# Patient Record
Sex: Female | Born: 2000
Health system: Southern US, Community
[De-identification: ages and names within clinical notes are randomized; demographics above are authoritative.]

## PROBLEM LIST (undated history)

## (undated) DIAGNOSIS — F329 Major depressive disorder, single episode, unspecified: Secondary | ICD-10-CM

## (undated) DIAGNOSIS — G4730 Sleep apnea, unspecified: Secondary | ICD-10-CM

## (undated) DIAGNOSIS — F32A Depression, unspecified: Secondary | ICD-10-CM

---
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
## 2018-01-05 DIAGNOSIS — S83006A Unspecified dislocation of unspecified patella, initial encounter: Secondary | ICD-10-CM | POA: Diagnosis not present

## 2018-01-10 DIAGNOSIS — S83006A Unspecified dislocation of unspecified patella, initial encounter: Secondary | ICD-10-CM | POA: Diagnosis not present

## 2018-01-12 DIAGNOSIS — S83006A Unspecified dislocation of unspecified patella, initial encounter: Secondary | ICD-10-CM | POA: Diagnosis not present

## 2018-01-17 DIAGNOSIS — S83006A Unspecified dislocation of unspecified patella, initial encounter: Secondary | ICD-10-CM | POA: Diagnosis not present

## 2018-01-19 DIAGNOSIS — S83006A Unspecified dislocation of unspecified patella, initial encounter: Secondary | ICD-10-CM | POA: Diagnosis not present

## 2018-01-26 DIAGNOSIS — S83005D Unspecified dislocation of left patella, subsequent encounter: Secondary | ICD-10-CM | POA: Diagnosis not present

## 2018-01-27 DIAGNOSIS — Z23 Encounter for immunization: Secondary | ICD-10-CM | POA: Diagnosis not present

## 2018-01-31 DIAGNOSIS — S83005D Unspecified dislocation of left patella, subsequent encounter: Secondary | ICD-10-CM | POA: Diagnosis not present

## 2018-02-02 DIAGNOSIS — S83005D Unspecified dislocation of left patella, subsequent encounter: Secondary | ICD-10-CM | POA: Diagnosis not present

## 2018-02-06 DIAGNOSIS — M25362 Other instability, left knee: Secondary | ICD-10-CM | POA: Diagnosis not present

## 2018-02-07 DIAGNOSIS — S83005D Unspecified dislocation of left patella, subsequent encounter: Secondary | ICD-10-CM | POA: Diagnosis not present

## 2018-02-13 DIAGNOSIS — S83005D Unspecified dislocation of left patella, subsequent encounter: Secondary | ICD-10-CM | POA: Diagnosis not present

## 2018-02-21 DIAGNOSIS — S83005D Unspecified dislocation of left patella, subsequent encounter: Secondary | ICD-10-CM | POA: Diagnosis not present

## 2018-03-01 DIAGNOSIS — F4323 Adjustment disorder with mixed anxiety and depressed mood: Secondary | ICD-10-CM | POA: Diagnosis not present

## 2018-03-10 ENCOUNTER — Ambulatory Visit: Payer: BLUE CROSS/BLUE SHIELD | Attending: Orthopedic Surgery | Admitting: Physical Therapy

## 2018-03-10 ENCOUNTER — Encounter: Payer: Self-pay | Admitting: Physical Therapy

## 2018-03-10 DIAGNOSIS — M25662 Stiffness of left knee, not elsewhere classified: Secondary | ICD-10-CM | POA: Diagnosis not present

## 2018-03-10 DIAGNOSIS — R262 Difficulty in walking, not elsewhere classified: Secondary | ICD-10-CM

## 2018-03-10 NOTE — Therapy (Signed)
[facility] [address] [place], [place], [address] Phone: [phone]   Fax:  [phone]  Physical Therapy Evaluation  Patient Details  Name: Patricia Tanner MRN: [id number] Date of Birth: 04/24/2000 Referring Provider (PT): Patsy LagerAlex Creighton   Encounter Date: 03/10/2018  PT End of Session - 03/10/18 0925    Visit Number  1    Number of Visits  9    Date for PT Re-Evaluation  05/11/18    PT Start Time  0845    PT Stop Time  0936    PT Time Calculation (min)  51 min    Activity Tolerance  Patient tolerated treatment well    Behavior During Therapy  [facility] for tasks assessed/performed       Past Medical History:  Diagnosis Date  . Depression   . Sleep apnea     History reviewed. No pertinent surgical history.  There were no vitals filed for this visit.   Subjective Assessment - 03/10/18 0854    Subjective  Has left patella dislocation about 5-6 years ago no issues since.  Reports that in august she was going down the stairs in August, it subluxed again and continued to have issues and pain.  Had left MPFL reconstruction 11/22/17.  [facility], she will be on break for the next few weeks and needs to continue PT for strength    Limitations  Lifting;Walking;House hold activities    Patient Stated Goals  be stronger, have no pain    Currently in Pain?  Yes    Pain Score  1     Pain Location  Knee    Pain Orientation  Left;Medial    Pain Descriptors / Indicators  Aching;Sore    Pain Type  Acute pain    Pain Onset  More than a month ago    Pain Frequency  Intermittent    Aggravating Factors   stairs, walking, squatting pain up to 7/10     Pain Relieving Factors  rest no pain    Effect of Pain on Daily Activities  pain at the end of the day         [facility] - 03/10/18 0001      Assessment   Medical Diagnosis  s/p left MPFL reconstruction    Referring  Provider (PT)  Patsy LagerAlex Creighton    Onset Date/Surgical Date  11/22/17    Prior Therapy  yes      Precautions   Precautions  None      Balance Screen   Has the patient fallen in the past 6 months  Yes    How many times?  1    Has the patient had a decrease in activity level because of a fear of falling?   No    Is the patient reluctant to leave their home because of a fear of falling?   No      Home Environment   Additional Comments  has stairs at home, at school      Prior Function   Level of Independence  Independent    Vocation  Student    Vocation Requirements  [employer]    Leisure  likes to jog, was doing gym activites      ROM / Strength   AROM / PROM / Strength  AROM;PROM;Strength      AROM   Overall AROM Comments  AROM  of the left knee 9-107 degrees with c/o tightness      PROM   Overall PROM Comments  PROM of the left knee 0-118 degrees with pain for flexion      Strength   Overall Strength Comments  4/5 no pain      Flexibility   Soft Tissue Assessment /Muscle Length  yes    Hamstrings  tight    Quadriceps  tight      Palpation   Palpation comment  some mild lateral tracking pain, weak VMO decreased contraction      Ambulation/Gait   Gait Comments  first few steps when she gets up are pretty stiff and antalgic, it smooths out as she goes, descending stairs there is pain and weakness, at times she reports that she does one at a time                Objective measurements completed on examination: See above findings.      [facility] Adult PT Treatment/Exercise - 03/10/18 0001      Exercises   Exercises  Knee/Hip      Knee/Hip Exercises: Aerobic   Elliptical  R=7 I=10x 4 minutes      Knee/Hip Exercises: Standing   Walking with Sports Cord  all directions    Other Standing Knee Exercises  Squats on Bosu      Knee/Hip Exercises: Supine   Short Arc Quad Sets Limitations  use of Russian 4on/12off x 9 minutes, al ot of verbal and tactile cues to work on  [employer]MO and TKE             PT Education - 03/10/18 0924    Education Details  HEP for  VMO SAQ, QS, and knee flexion    Person(s) Educated  Patient    Methods  Explanation;Demonstration;Tactile cues;Verbal cues;Handout    Comprehension  Verbalized understanding;Returned demonstration;Verbal cues required;Tactile cues required       PT Short Term Goals - 03/10/18 0935      PT SHORT TERM GOAL #1   Title  independent with initial HEP    Time  1    Period  Weeks    Status  New        PT Long Term Goals - 03/10/18 0935      PT LONG TERM GOAL #1   Title  increase knee extnesion to full    Time  4    Period  Weeks    Status  New      PT LONG TERM GOAL #2   Title  increase ability to descend stairs at all times step over step    Time  8    Period  Weeks    Status  New             Plan - 03/10/18 0925    Clinical Impression Statement  Patient underwent left MPFL on 11/22/17, [facility] reports doing well overall but still pain, has decreased ROM, and very weak VMO.  AROM is 9-107 degrees flexion, poor TKE, VMO and tight quad, her first few steps are antalgic and stiff and then it smooths out.  Stairs are difficult to descend    Clinical Presentation  Stable    Clinical Decision Making  Low    PT Frequency  1x / week    PT Duration  4 weeks    PT Treatment/Interventions  ADLs/Self Care Home Management;Cryotherapy;Electrical Stimulation;Therapeutic exercise;Therapeutic activities;Functional mobility training;Stair training;Gait training;Balance  training;Neuromuscular re-education;Patient/family education    PT Next Visit Plan  increase VMO, TKE and gain flexion    Consulted and Agree with Plan of Care  Patient       Patient will benefit from skilled therapeutic intervention in order to improve the following deficits and impairments:  Abnormal gait, Pain, Decreased range of motion, Decreased strength, Impaired flexibility, Decreased balance  Visit  Diagnosis: Stiffness of left knee, not elsewhere classified - Plan: PT plan of care cert/re-cert  Difficulty in walking, not elsewhere classified - Plan: PT plan of care cert/re-cert     Problem List There are no active problems to display for this patient.   Jearld Lesch., PT 03/10/2018, 9:38 AM  [facility] [address] [place], [place], [address] Phone: [phone]   Fax:  [phone]  Name: Patricia Tanner MRN: [id number] Date of Birth: Jun 23, 2000

## 2018-03-13 DIAGNOSIS — F322 Major depressive disorder, single episode, severe without psychotic features: Secondary | ICD-10-CM | POA: Diagnosis not present

## 2018-03-13 DIAGNOSIS — L709 Acne, unspecified: Secondary | ICD-10-CM | POA: Diagnosis not present

## 2018-03-13 DIAGNOSIS — F411 Generalized anxiety disorder: Secondary | ICD-10-CM | POA: Diagnosis not present

## 2018-03-14 ENCOUNTER — Ambulatory Visit: Payer: BLUE CROSS/BLUE SHIELD | Admitting: Physical Therapy

## 2018-03-14 DIAGNOSIS — R262 Difficulty in walking, not elsewhere classified: Secondary | ICD-10-CM

## 2018-03-14 DIAGNOSIS — M25662 Stiffness of left knee, not elsewhere classified: Secondary | ICD-10-CM

## 2018-03-14 NOTE — Therapy (Signed)
[facility] [address] [place], [place], [address] Phone: [phone]   Fax:  [phone]  Physical Therapy Treatment  Patient Details  Name: Patricia SpanishMaansi Janak Lazaro MRN: [id number] Date of Birth: 2000/08/11 Referring Provider (PT): Patsy LagerAlex Creighton   Encounter Date: 03/14/2018  PT End of Session - 03/14/18 0835    Visit Number  2    Number of Visits  9    Date for PT Re-Evaluation  05/11/18    PT Start Time  0800    PT Stop Time  0850    PT Time Calculation (min)  50 min       Past Medical History:  Diagnosis Date  . Depression   . Sleep apnea     No past surgical history on file.  There were no vitals filed for this visit.  Subjective Assessment - 03/14/18 0804    Subjective  no pain , just alittle stiff    Currently in Pain?  No/denies                       [facility] Adult PT Treatment/Exercise - 03/14/18 0001      Knee/Hip Exercises: Aerobic   Elliptical  R=7 I=10x 3 fwd/3 back    Tread Mill  2 min each side      Knee/Hip Exercises: Machines for Strengthening   Cybex Leg Press  VMO ball squeeze 30# 3 sets 10      Knee/Hip Exercises: Standing   Hip Flexion  Stengthening;Left;20 reps;Knee straight   2nd set with ER, green tband   Wall Squat  2 sets;5 reps;10 seconds   VMO ball squeeze   SLS with Vectors  0n airex    Walking with Sports Cord  all directions   resisted running man     Knee/Hip Exercises: Supine   Short Arc Quad Sets Limitations  Russian 12 min 5 on/5 off with SAQ    cuing for [employer]MO activation     Modalities   Modalities  Electrical Stimulation               PT Short Term Goals - 03/10/18 0935      PT SHORT TERM GOAL #1   Title  independent with initial HEP    Time  1    Period  Weeks    Status  New        PT Long Term Goals - 03/10/18 0935      PT LONG TERM GOAL #1   Title  increase knee extnesion to full    Time  4    Period  Weeks    Status   New      PT LONG TERM GOAL #2   Title  increase ability to descend stairs at all times step over step    Time  8    Period  Weeks    Status  New            Plan - 03/14/18 [id number]    Clinical Impression Statement  pt tolerated ther ex well, some visable shaking of left quad and cuing to activate VMO with exercises. cued withgait to flex left knee in swing phase .    PT Treatment/Interventions  ADLs/Self Care Home Management;Cryotherapy;Electrical Stimulation;Therapeutic exercise;Therapeutic activities;Functional mobility training;Stair training;Gait training;Balance training;Neuromuscular re-education;Patient/family education    PT Next Visit Plan  increase VMO, TKE and gain flexion       Patient will benefit  from skilled therapeutic intervention in order to improve the following deficits and impairments:  Abnormal gait, Pain, Decreased range of motion, Decreased strength, Impaired flexibility, Decreased balance  Visit Diagnosis: Stiffness of left knee, not elsewhere classified  Difficulty in walking, not elsewhere classified     Problem List There are no active problems to display for this patient.   PAYSEUR,ANGIE PTA 03/14/2018, 8:37 AM  [facility] [address] [place], [place], [address] Phone: [phone]   Fax:  [phone]  Name: Patricia SpanishMaansi Janak Zeiner MRN: [id number] Date of Birth: 2000-09-29

## 2018-03-24 ENCOUNTER — Ambulatory Visit: Payer: BLUE CROSS/BLUE SHIELD | Attending: Orthopedic Surgery | Admitting: Physical Therapy

## 2018-03-24 DIAGNOSIS — R262 Difficulty in walking, not elsewhere classified: Secondary | ICD-10-CM | POA: Diagnosis not present

## 2018-03-24 DIAGNOSIS — M25662 Stiffness of left knee, not elsewhere classified: Secondary | ICD-10-CM | POA: Insufficient documentation

## 2018-03-24 NOTE — Therapy (Signed)
[place], [place], [address] Phone: [phone]   Fax:  [phone]  Physical Therapy Treatment  Patient Details  Name: Patricia Tanner MRN: [id number] Date of Birth: [birth year] Referring Provider (PT): Lennox Solders   Encounter Date: 03/24/2018  PT End of Session - 03/24/18 1003    Visit Number  3    Number of Visits  9    Date for PT Re-Evaluation  05/11/18    PT Start Time  0930    PT Stop Time  1028    PT Time Calculation (min)  58 min       Past Medical History:  Diagnosis Date  . Depression   . Sleep apnea     No past surgical history on file.  There were no vitals filed for this visit.  Subjective Assessment - 03/24/18 0933    Subjective  trying to bend more with walk and alittle more sore    Currently in Pain?  Yes    Pain Score  6     Pain Location  Knee    Pain Orientation  Left                       [facility] Adult PT Treatment/Exercise - 03/24/18 0001      Knee/Hip Exercises: Aerobic   Elliptical  R=7 I=10x 3 fwd/3 back    Tread Mill  2 min each side      Knee/Hip Exercises: Machines for Strengthening   Cybex Knee Extension  VMO ball squeeze Left only 4 sets 5 5#    Cybex Leg Press  VMO ball squeeze 30# 3 sets 10    Other Machine  cable press down for TKE 2 sets 15 30#      Knee/Hip Exercises: Standing   Lateral Step Up  Left;15 reps;Hand Hold: 2;Step Height: 6"    Forward Step Up  Left;15 reps;Step Height: 6";Hand Hold: 2    Other Standing Knee Exercises  monster walks with tband    Other Standing Knee Exercises  TKE pushing ball into wall hold 3 sec 15      Knee/Hip Exercises: Supine   Short Arc Quad Sets Limitations  Russian 5on/5 off 3# with SAQ   10 min     Modalities   Modalities  Teacher, English as a foreign language Location  left knee    Electrical Stimulation Action  IFC    Electrical  Stimulation Parameters  supine with ice pack    Electrical Stimulation Goals  Edema;Pain               PT Short Term Goals - 03/24/18 0934      PT SHORT TERM GOAL #1   Title  independent with initial HEP    Status  Achieved        PT Long Term Goals - 03/24/18 0934      PT LONG TERM GOAL #1   Title  increase knee extnesion to full    Status  Partially Met      PT LONG TERM GOAL #2   Title  increase ability to descend stairs at all times step over step    Status  Partially Met            Plan - 03/24/18 0934    Clinical Impression Statement  STG met. LTG partially met. Increased swelling noted today.  Tolerated ther ex well.    PT Treatment/Interventions  ADLs/Self Care Home Management;Cryotherapy;Electrical Stimulation;Therapeutic exercise;Therapeutic activities;Functional mobility training;Stair training;Gait training;Balance training;Neuromuscular re-education;Patient/family education    PT Next Visit Plan  D/C as pt will resume PT when returns to college Monday       Patient will benefit from skilled therapeutic intervention in order to improve the following deficits and impairments:  Abnormal gait, Pain, Decreased range of motion, Decreased strength, Impaired flexibility, Decreased balance  Visit Diagnosis: Stiffness of left knee, not elsewhere classified  Difficulty in walking, not elsewhere classified     Problem List There are no active problems to display for this patient.  PHYSICAL THERAPY DISCHARGE SUMMARY   Plan: Patient agrees to discharge.  Patient goals were partially met. Patient is being discharged due to the patient's request.  ?????      PAYSEUR,ANGIE PTA 03/24/2018, 10:03 AM  [facility], [place], [address] Phone: [phone]   Fax:  [phone]  Name: Patricia Tanner MRN: [id number] Date of Birth: February 16, 2001

## 2018-04-06 DIAGNOSIS — F4323 Adjustment disorder with mixed anxiety and depressed mood: Secondary | ICD-10-CM | POA: Diagnosis not present

## 2018-04-07 DIAGNOSIS — L700 Acne vulgaris: Secondary | ICD-10-CM | POA: Diagnosis not present

## 2018-04-14 DIAGNOSIS — F322 Major depressive disorder, single episode, severe without psychotic features: Secondary | ICD-10-CM | POA: Diagnosis not present

## 2018-04-19 DIAGNOSIS — S83005D Unspecified dislocation of left patella, subsequent encounter: Secondary | ICD-10-CM | POA: Diagnosis not present

## 2018-04-21 DIAGNOSIS — F4323 Adjustment disorder with mixed anxiety and depressed mood: Secondary | ICD-10-CM | POA: Diagnosis not present

## 2018-04-26 DIAGNOSIS — S83005D Unspecified dislocation of left patella, subsequent encounter: Secondary | ICD-10-CM | POA: Diagnosis not present

## 2018-05-03 DIAGNOSIS — S83005D Unspecified dislocation of left patella, subsequent encounter: Secondary | ICD-10-CM | POA: Diagnosis not present

## 2018-05-08 DIAGNOSIS — Z13220 Encounter for screening for lipoid disorders: Secondary | ICD-10-CM | POA: Diagnosis not present

## 2018-05-08 DIAGNOSIS — L700 Acne vulgaris: Secondary | ICD-10-CM | POA: Diagnosis not present

## 2018-05-17 DIAGNOSIS — S83005D Unspecified dislocation of left patella, subsequent encounter: Secondary | ICD-10-CM | POA: Diagnosis not present

## 2018-05-19 DIAGNOSIS — F322 Major depressive disorder, single episode, severe without psychotic features: Secondary | ICD-10-CM | POA: Diagnosis not present

## 2018-05-24 DIAGNOSIS — S83005D Unspecified dislocation of left patella, subsequent encounter: Secondary | ICD-10-CM | POA: Diagnosis not present

## 2018-06-03 DIAGNOSIS — F4323 Adjustment disorder with mixed anxiety and depressed mood: Secondary | ICD-10-CM | POA: Diagnosis not present

## 2018-06-08 DIAGNOSIS — Z79899 Other long term (current) drug therapy: Secondary | ICD-10-CM | POA: Diagnosis not present

## 2018-06-09 DIAGNOSIS — S83005D Unspecified dislocation of left patella, subsequent encounter: Secondary | ICD-10-CM | POA: Diagnosis not present

## 2018-07-20 DIAGNOSIS — Z79899 Other long term (current) drug therapy: Secondary | ICD-10-CM | POA: Diagnosis not present

## 2018-08-29 DIAGNOSIS — F322 Major depressive disorder, single episode, severe without psychotic features: Secondary | ICD-10-CM | POA: Diagnosis not present

## 2018-09-08 DIAGNOSIS — L700 Acne vulgaris: Secondary | ICD-10-CM | POA: Diagnosis not present

## 2018-10-03 DIAGNOSIS — F330 Major depressive disorder, recurrent, mild: Secondary | ICD-10-CM | POA: Diagnosis not present

## 2018-10-03 DIAGNOSIS — F411 Generalized anxiety disorder: Secondary | ICD-10-CM | POA: Diagnosis not present

## 2018-10-03 DIAGNOSIS — F902 Attention-deficit hyperactivity disorder, combined type: Secondary | ICD-10-CM | POA: Diagnosis not present

## 2018-10-11 DIAGNOSIS — F330 Major depressive disorder, recurrent, mild: Secondary | ICD-10-CM | POA: Diagnosis not present

## 2018-10-11 DIAGNOSIS — F411 Generalized anxiety disorder: Secondary | ICD-10-CM | POA: Diagnosis not present

## 2018-10-11 DIAGNOSIS — F902 Attention-deficit hyperactivity disorder, combined type: Secondary | ICD-10-CM | POA: Diagnosis not present

## 2018-10-25 DIAGNOSIS — F902 Attention-deficit hyperactivity disorder, combined type: Secondary | ICD-10-CM | POA: Diagnosis not present

## 2018-10-25 DIAGNOSIS — F411 Generalized anxiety disorder: Secondary | ICD-10-CM | POA: Diagnosis not present

## 2018-10-25 DIAGNOSIS — F330 Major depressive disorder, recurrent, mild: Secondary | ICD-10-CM | POA: Diagnosis not present

## 2018-11-02 DIAGNOSIS — Z79899 Other long term (current) drug therapy: Secondary | ICD-10-CM | POA: Diagnosis not present

## 2018-11-07 DIAGNOSIS — F909 Attention-deficit hyperactivity disorder, unspecified type: Secondary | ICD-10-CM | POA: Diagnosis not present

## 2018-11-07 DIAGNOSIS — F1210 Cannabis abuse, uncomplicated: Secondary | ICD-10-CM | POA: Diagnosis not present

## 2018-11-07 DIAGNOSIS — F322 Major depressive disorder, single episode, severe without psychotic features: Secondary | ICD-10-CM | POA: Diagnosis not present

## 2018-12-01 DIAGNOSIS — F4323 Adjustment disorder with mixed anxiety and depressed mood: Secondary | ICD-10-CM | POA: Diagnosis not present

## 2018-12-05 DIAGNOSIS — F909 Attention-deficit hyperactivity disorder, unspecified type: Secondary | ICD-10-CM | POA: Diagnosis not present

## 2018-12-05 DIAGNOSIS — F322 Major depressive disorder, single episode, severe without psychotic features: Secondary | ICD-10-CM | POA: Diagnosis not present

## 2018-12-22 DIAGNOSIS — F4323 Adjustment disorder with mixed anxiety and depressed mood: Secondary | ICD-10-CM | POA: Diagnosis not present

## 2019-01-16 DIAGNOSIS — F4323 Adjustment disorder with mixed anxiety and depressed mood: Secondary | ICD-10-CM | POA: Diagnosis not present

## 2019-02-06 DIAGNOSIS — F4323 Adjustment disorder with mixed anxiety and depressed mood: Secondary | ICD-10-CM | POA: Diagnosis not present

## 2019-02-19 DIAGNOSIS — F4323 Adjustment disorder with mixed anxiety and depressed mood: Secondary | ICD-10-CM | POA: Diagnosis not present

## 2019-02-22 DIAGNOSIS — F331 Major depressive disorder, recurrent, moderate: Secondary | ICD-10-CM | POA: Diagnosis not present

## 2019-02-22 DIAGNOSIS — F902 Attention-deficit hyperactivity disorder, combined type: Secondary | ICD-10-CM | POA: Diagnosis not present

## 2019-03-01 DIAGNOSIS — Z20828 Contact with and (suspected) exposure to other viral communicable diseases: Secondary | ICD-10-CM | POA: Diagnosis not present

## 2019-03-01 DIAGNOSIS — U071 COVID-19: Secondary | ICD-10-CM | POA: Diagnosis not present

## 2019-03-07 DIAGNOSIS — F331 Major depressive disorder, recurrent, moderate: Secondary | ICD-10-CM | POA: Diagnosis not present

## 2019-03-07 DIAGNOSIS — F902 Attention-deficit hyperactivity disorder, combined type: Secondary | ICD-10-CM | POA: Diagnosis not present

## 2019-03-13 DIAGNOSIS — Z113 Encounter for screening for infections with a predominantly sexual mode of transmission: Secondary | ICD-10-CM | POA: Diagnosis not present

## 2019-03-13 DIAGNOSIS — Z3200 Encounter for pregnancy test, result unknown: Secondary | ICD-10-CM | POA: Diagnosis not present

## 2019-03-13 DIAGNOSIS — Z30017 Encounter for initial prescription of implantable subdermal contraceptive: Secondary | ICD-10-CM | POA: Diagnosis not present

## 2019-04-09 DIAGNOSIS — F4323 Adjustment disorder with mixed anxiety and depressed mood: Secondary | ICD-10-CM | POA: Diagnosis not present

## 2019-04-13 DIAGNOSIS — F331 Major depressive disorder, recurrent, moderate: Secondary | ICD-10-CM | POA: Diagnosis not present

## 2019-04-13 DIAGNOSIS — F902 Attention-deficit hyperactivity disorder, combined type: Secondary | ICD-10-CM | POA: Diagnosis not present

## 2019-04-23 DIAGNOSIS — F4323 Adjustment disorder with mixed anxiety and depressed mood: Secondary | ICD-10-CM | POA: Diagnosis not present

## 2019-05-11 DIAGNOSIS — F331 Major depressive disorder, recurrent, moderate: Secondary | ICD-10-CM | POA: Diagnosis not present

## 2019-05-11 DIAGNOSIS — F902 Attention-deficit hyperactivity disorder, combined type: Secondary | ICD-10-CM | POA: Diagnosis not present

## 2019-05-14 DIAGNOSIS — F4323 Adjustment disorder with mixed anxiety and depressed mood: Secondary | ICD-10-CM | POA: Diagnosis not present

## 2019-06-11 DIAGNOSIS — F4323 Adjustment disorder with mixed anxiety and depressed mood: Secondary | ICD-10-CM | POA: Diagnosis not present

## 2019-06-29 DIAGNOSIS — Z23 Encounter for immunization: Secondary | ICD-10-CM | POA: Diagnosis not present

## 2019-07-09 DIAGNOSIS — F4323 Adjustment disorder with mixed anxiety and depressed mood: Secondary | ICD-10-CM | POA: Diagnosis not present

## 2019-07-10 DIAGNOSIS — F902 Attention-deficit hyperactivity disorder, combined type: Secondary | ICD-10-CM | POA: Diagnosis not present

## 2019-07-10 DIAGNOSIS — F331 Major depressive disorder, recurrent, moderate: Secondary | ICD-10-CM | POA: Diagnosis not present

## 2019-08-13 DIAGNOSIS — F4323 Adjustment disorder with mixed anxiety and depressed mood: Secondary | ICD-10-CM | POA: Diagnosis not present

## 2019-09-02 DIAGNOSIS — J039 Acute tonsillitis, unspecified: Secondary | ICD-10-CM | POA: Diagnosis not present

## 2019-09-02 DIAGNOSIS — R070 Pain in throat: Secondary | ICD-10-CM | POA: Diagnosis not present

## 2019-09-04 DIAGNOSIS — J039 Acute tonsillitis, unspecified: Secondary | ICD-10-CM | POA: Diagnosis not present

## 2019-09-06 DIAGNOSIS — F902 Attention-deficit hyperactivity disorder, combined type: Secondary | ICD-10-CM | POA: Diagnosis not present

## 2019-09-06 DIAGNOSIS — F331 Major depressive disorder, recurrent, moderate: Secondary | ICD-10-CM | POA: Diagnosis not present

## 2019-09-11 DIAGNOSIS — F4323 Adjustment disorder with mixed anxiety and depressed mood: Secondary | ICD-10-CM | POA: Diagnosis not present

## 2019-10-24 DIAGNOSIS — F4322 Adjustment disorder with anxiety: Secondary | ICD-10-CM | POA: Diagnosis not present

## 2019-11-30 DIAGNOSIS — F4322 Adjustment disorder with anxiety: Secondary | ICD-10-CM | POA: Diagnosis not present

## 2019-12-17 DIAGNOSIS — N760 Acute vaginitis: Secondary | ICD-10-CM | POA: Diagnosis not present

## 2019-12-21 DIAGNOSIS — F331 Major depressive disorder, recurrent, moderate: Secondary | ICD-10-CM | POA: Diagnosis not present

## 2019-12-21 DIAGNOSIS — F902 Attention-deficit hyperactivity disorder, combined type: Secondary | ICD-10-CM | POA: Diagnosis not present

## 2019-12-28 DIAGNOSIS — F4322 Adjustment disorder with anxiety: Secondary | ICD-10-CM | POA: Diagnosis not present

## 2020-02-01 DIAGNOSIS — F4322 Adjustment disorder with anxiety: Secondary | ICD-10-CM | POA: Diagnosis not present

## 2020-02-06 DIAGNOSIS — F331 Major depressive disorder, recurrent, moderate: Secondary | ICD-10-CM | POA: Diagnosis not present

## 2020-02-06 DIAGNOSIS — F902 Attention-deficit hyperactivity disorder, combined type: Secondary | ICD-10-CM | POA: Diagnosis not present

## 2020-02-27 DIAGNOSIS — F4322 Adjustment disorder with anxiety: Secondary | ICD-10-CM | POA: Diagnosis not present

## 2020-03-05 DIAGNOSIS — F902 Attention-deficit hyperactivity disorder, combined type: Secondary | ICD-10-CM | POA: Diagnosis not present

## 2020-03-05 DIAGNOSIS — F331 Major depressive disorder, recurrent, moderate: Secondary | ICD-10-CM | POA: Diagnosis not present

## 2020-03-10 DIAGNOSIS — Z23 Encounter for immunization: Secondary | ICD-10-CM | POA: Diagnosis not present

## 2020-03-13 DIAGNOSIS — Z20822 Contact with and (suspected) exposure to covid-19: Secondary | ICD-10-CM | POA: Diagnosis not present

## 2020-03-31 DIAGNOSIS — F4322 Adjustment disorder with anxiety: Secondary | ICD-10-CM | POA: Diagnosis not present

## 2020-04-28 DIAGNOSIS — F4323 Adjustment disorder with mixed anxiety and depressed mood: Secondary | ICD-10-CM | POA: Diagnosis not present

## 2020-05-05 DIAGNOSIS — F902 Attention-deficit hyperactivity disorder, combined type: Secondary | ICD-10-CM | POA: Diagnosis not present

## 2020-05-05 DIAGNOSIS — F331 Major depressive disorder, recurrent, moderate: Secondary | ICD-10-CM | POA: Diagnosis not present

## 2020-05-29 DIAGNOSIS — F4323 Adjustment disorder with mixed anxiety and depressed mood: Secondary | ICD-10-CM | POA: Diagnosis not present

## 2020-06-04 DIAGNOSIS — F4323 Adjustment disorder with mixed anxiety and depressed mood: Secondary | ICD-10-CM | POA: Diagnosis not present

## 2020-06-06 DIAGNOSIS — F419 Anxiety disorder, unspecified: Secondary | ICD-10-CM | POA: Diagnosis not present

## 2020-06-06 DIAGNOSIS — R142 Eructation: Secondary | ICD-10-CM | POA: Diagnosis not present

## 2020-06-06 DIAGNOSIS — F329 Major depressive disorder, single episode, unspecified: Secondary | ICD-10-CM | POA: Diagnosis not present

## 2020-06-06 DIAGNOSIS — G4700 Insomnia, unspecified: Secondary | ICD-10-CM | POA: Diagnosis not present

## 2020-06-09 DIAGNOSIS — F4323 Adjustment disorder with mixed anxiety and depressed mood: Secondary | ICD-10-CM | POA: Diagnosis not present

## 2020-06-16 DIAGNOSIS — F411 Generalized anxiety disorder: Secondary | ICD-10-CM | POA: Diagnosis not present

## 2020-06-16 DIAGNOSIS — F909 Attention-deficit hyperactivity disorder, unspecified type: Secondary | ICD-10-CM | POA: Diagnosis not present

## 2020-06-16 DIAGNOSIS — F322 Major depressive disorder, single episode, severe without psychotic features: Secondary | ICD-10-CM | POA: Diagnosis not present

## 2020-06-26 DIAGNOSIS — F4323 Adjustment disorder with mixed anxiety and depressed mood: Secondary | ICD-10-CM | POA: Diagnosis not present

## 2020-06-27 DIAGNOSIS — F900 Attention-deficit hyperactivity disorder, predominantly inattentive type: Secondary | ICD-10-CM | POA: Diagnosis not present

## 2020-06-27 DIAGNOSIS — F411 Generalized anxiety disorder: Secondary | ICD-10-CM | POA: Diagnosis not present

## 2020-06-27 DIAGNOSIS — Z79891 Long term (current) use of opiate analgesic: Secondary | ICD-10-CM | POA: Diagnosis not present

## 2020-06-30 DIAGNOSIS — F430 Acute stress reaction: Secondary | ICD-10-CM | POA: Diagnosis not present

## 2020-06-30 DIAGNOSIS — F331 Major depressive disorder, recurrent, moderate: Secondary | ICD-10-CM | POA: Diagnosis not present

## 2020-06-30 DIAGNOSIS — F902 Attention-deficit hyperactivity disorder, combined type: Secondary | ICD-10-CM | POA: Diagnosis not present

## 2020-07-30 DIAGNOSIS — F331 Major depressive disorder, recurrent, moderate: Secondary | ICD-10-CM | POA: Diagnosis not present

## 2020-07-30 DIAGNOSIS — F411 Generalized anxiety disorder: Secondary | ICD-10-CM | POA: Diagnosis not present

## 2020-08-07 DIAGNOSIS — F4323 Adjustment disorder with mixed anxiety and depressed mood: Secondary | ICD-10-CM | POA: Diagnosis not present

## 2020-08-26 DIAGNOSIS — Z79899 Other long term (current) drug therapy: Secondary | ICD-10-CM | POA: Diagnosis not present

## 2020-08-27 DIAGNOSIS — F4323 Adjustment disorder with mixed anxiety and depressed mood: Secondary | ICD-10-CM | POA: Diagnosis not present

## 2020-08-28 DIAGNOSIS — F411 Generalized anxiety disorder: Secondary | ICD-10-CM | POA: Diagnosis not present

## 2020-08-28 DIAGNOSIS — F331 Major depressive disorder, recurrent, moderate: Secondary | ICD-10-CM | POA: Diagnosis not present

## 2020-09-08 DIAGNOSIS — F411 Generalized anxiety disorder: Secondary | ICD-10-CM | POA: Diagnosis not present

## 2020-09-08 DIAGNOSIS — F322 Major depressive disorder, single episode, severe without psychotic features: Secondary | ICD-10-CM | POA: Diagnosis not present

## 2020-09-08 DIAGNOSIS — G4700 Insomnia, unspecified: Secondary | ICD-10-CM | POA: Diagnosis not present

## 2020-09-10 DIAGNOSIS — F4323 Adjustment disorder with mixed anxiety and depressed mood: Secondary | ICD-10-CM | POA: Diagnosis not present

## 2020-09-16 DIAGNOSIS — F4323 Adjustment disorder with mixed anxiety and depressed mood: Secondary | ICD-10-CM | POA: Diagnosis not present

## 2020-10-03 DIAGNOSIS — F4323 Adjustment disorder with mixed anxiety and depressed mood: Secondary | ICD-10-CM | POA: Diagnosis not present

## 2020-10-27 DIAGNOSIS — F331 Major depressive disorder, recurrent, moderate: Secondary | ICD-10-CM | POA: Diagnosis not present

## 2020-10-27 DIAGNOSIS — F411 Generalized anxiety disorder: Secondary | ICD-10-CM | POA: Diagnosis not present

## 2021-01-26 DIAGNOSIS — F411 Generalized anxiety disorder: Secondary | ICD-10-CM | POA: Diagnosis not present

## 2021-01-26 DIAGNOSIS — F331 Major depressive disorder, recurrent, moderate: Secondary | ICD-10-CM | POA: Diagnosis not present

## 2021-03-27 DIAGNOSIS — F411 Generalized anxiety disorder: Secondary | ICD-10-CM | POA: Diagnosis not present

## 2021-03-27 DIAGNOSIS — F900 Attention-deficit hyperactivity disorder, predominantly inattentive type: Secondary | ICD-10-CM | POA: Diagnosis not present

## 2021-06-30 DIAGNOSIS — F411 Generalized anxiety disorder: Secondary | ICD-10-CM | POA: Diagnosis not present

## 2021-06-30 DIAGNOSIS — F3341 Major depressive disorder, recurrent, in partial remission: Secondary | ICD-10-CM | POA: Diagnosis not present

## 2021-06-30 DIAGNOSIS — F900 Attention-deficit hyperactivity disorder, predominantly inattentive type: Secondary | ICD-10-CM | POA: Diagnosis not present

## 2021-08-24 DIAGNOSIS — F411 Generalized anxiety disorder: Secondary | ICD-10-CM | POA: Diagnosis not present

## 2021-08-24 DIAGNOSIS — G4719 Other hypersomnia: Secondary | ICD-10-CM | POA: Diagnosis not present

## 2021-09-28 DIAGNOSIS — F900 Attention-deficit hyperactivity disorder, predominantly inattentive type: Secondary | ICD-10-CM | POA: Diagnosis not present

## 2021-09-28 DIAGNOSIS — F3341 Major depressive disorder, recurrent, in partial remission: Secondary | ICD-10-CM | POA: Diagnosis not present

## 2021-09-28 DIAGNOSIS — F411 Generalized anxiety disorder: Secondary | ICD-10-CM | POA: Diagnosis not present

## 2021-10-01 DIAGNOSIS — F411 Generalized anxiety disorder: Secondary | ICD-10-CM | POA: Diagnosis not present

## 2021-10-01 DIAGNOSIS — E669 Obesity, unspecified: Secondary | ICD-10-CM | POA: Diagnosis not present

## 2021-10-01 DIAGNOSIS — G4719 Other hypersomnia: Secondary | ICD-10-CM | POA: Diagnosis not present

## 2021-10-01 DIAGNOSIS — G4710 Hypersomnia, unspecified: Secondary | ICD-10-CM | POA: Diagnosis not present

## 2021-10-26 DIAGNOSIS — Z23 Encounter for immunization: Secondary | ICD-10-CM | POA: Diagnosis not present

## 2021-11-02 DIAGNOSIS — F411 Generalized anxiety disorder: Secondary | ICD-10-CM | POA: Diagnosis not present

## 2021-11-02 DIAGNOSIS — F900 Attention-deficit hyperactivity disorder, predominantly inattentive type: Secondary | ICD-10-CM | POA: Diagnosis not present

## 2021-11-02 DIAGNOSIS — F331 Major depressive disorder, recurrent, moderate: Secondary | ICD-10-CM | POA: Diagnosis not present

## 2021-11-12 DIAGNOSIS — R0683 Snoring: Secondary | ICD-10-CM | POA: Diagnosis not present

## 2021-12-01 DIAGNOSIS — Z5321 Procedure and treatment not carried out due to patient leaving prior to being seen by health care provider: Secondary | ICD-10-CM | POA: Diagnosis not present

## 2021-12-01 DIAGNOSIS — F3010 Manic episode without psychotic symptoms, unspecified: Secondary | ICD-10-CM | POA: Diagnosis not present

## 2021-12-01 DIAGNOSIS — F309 Manic episode, unspecified: Secondary | ICD-10-CM | POA: Diagnosis not present

## 2021-12-07 DIAGNOSIS — T63441A Toxic effect of venom of bees, accidental (unintentional), initial encounter: Secondary | ICD-10-CM | POA: Diagnosis not present

## 2021-12-07 DIAGNOSIS — F411 Generalized anxiety disorder: Secondary | ICD-10-CM | POA: Diagnosis not present

## 2021-12-07 DIAGNOSIS — F900 Attention-deficit hyperactivity disorder, predominantly inattentive type: Secondary | ICD-10-CM | POA: Diagnosis not present

## 2021-12-07 DIAGNOSIS — F331 Major depressive disorder, recurrent, moderate: Secondary | ICD-10-CM | POA: Diagnosis not present

## 2021-12-07 DIAGNOSIS — F909 Attention-deficit hyperactivity disorder, unspecified type: Secondary | ICD-10-CM | POA: Diagnosis not present

## 2021-12-07 DIAGNOSIS — M79671 Pain in right foot: Secondary | ICD-10-CM | POA: Diagnosis not present

## 2021-12-07 DIAGNOSIS — F329 Major depressive disorder, single episode, unspecified: Secondary | ICD-10-CM | POA: Diagnosis not present

## 2021-12-22 DIAGNOSIS — F900 Attention-deficit hyperactivity disorder, predominantly inattentive type: Secondary | ICD-10-CM | POA: Diagnosis not present

## 2021-12-22 DIAGNOSIS — F3162 Bipolar disorder, current episode mixed, moderate: Secondary | ICD-10-CM | POA: Diagnosis not present

## 2021-12-22 DIAGNOSIS — F411 Generalized anxiety disorder: Secondary | ICD-10-CM | POA: Diagnosis not present

## 2021-12-23 DIAGNOSIS — F411 Generalized anxiety disorder: Secondary | ICD-10-CM | POA: Diagnosis not present

## 2022-01-05 DIAGNOSIS — F411 Generalized anxiety disorder: Secondary | ICD-10-CM | POA: Diagnosis not present

## 2022-01-19 DIAGNOSIS — F411 Generalized anxiety disorder: Secondary | ICD-10-CM | POA: Diagnosis not present

## 2022-01-19 DIAGNOSIS — F4312 Post-traumatic stress disorder, chronic: Secondary | ICD-10-CM | POA: Diagnosis not present

## 2022-02-16 DIAGNOSIS — F319 Bipolar disorder, unspecified: Secondary | ICD-10-CM | POA: Diagnosis not present

## 2022-02-16 DIAGNOSIS — F411 Generalized anxiety disorder: Secondary | ICD-10-CM | POA: Diagnosis not present

## 2022-02-16 DIAGNOSIS — F4312 Post-traumatic stress disorder, chronic: Secondary | ICD-10-CM | POA: Diagnosis not present

## 2022-03-18 DIAGNOSIS — F3131 Bipolar disorder, current episode depressed, mild: Secondary | ICD-10-CM | POA: Diagnosis not present

## 2022-03-18 DIAGNOSIS — F411 Generalized anxiety disorder: Secondary | ICD-10-CM | POA: Diagnosis not present

## 2022-03-18 DIAGNOSIS — F4312 Post-traumatic stress disorder, chronic: Secondary | ICD-10-CM | POA: Diagnosis not present

## 2022-04-08 DIAGNOSIS — F3131 Bipolar disorder, current episode depressed, mild: Secondary | ICD-10-CM | POA: Diagnosis not present

## 2022-04-08 DIAGNOSIS — F4312 Post-traumatic stress disorder, chronic: Secondary | ICD-10-CM | POA: Diagnosis not present

## 2022-04-08 DIAGNOSIS — F411 Generalized anxiety disorder: Secondary | ICD-10-CM | POA: Diagnosis not present

## 2022-05-03 DIAGNOSIS — M79671 Pain in right foot: Secondary | ICD-10-CM | POA: Diagnosis not present

## 2022-05-27 DIAGNOSIS — Z5181 Encounter for therapeutic drug level monitoring: Secondary | ICD-10-CM | POA: Diagnosis not present

## 2022-05-27 DIAGNOSIS — F4312 Post-traumatic stress disorder, chronic: Secondary | ICD-10-CM | POA: Diagnosis not present

## 2022-05-27 DIAGNOSIS — F411 Generalized anxiety disorder: Secondary | ICD-10-CM | POA: Diagnosis not present

## 2022-05-27 DIAGNOSIS — Z79891 Long term (current) use of opiate analgesic: Secondary | ICD-10-CM | POA: Diagnosis not present

## 2022-05-27 DIAGNOSIS — F3131 Bipolar disorder, current episode depressed, mild: Secondary | ICD-10-CM | POA: Diagnosis not present

## 2022-05-27 DIAGNOSIS — Z79899 Other long term (current) drug therapy: Secondary | ICD-10-CM | POA: Diagnosis not present

## 2022-06-29 DIAGNOSIS — Z79891 Long term (current) use of opiate analgesic: Secondary | ICD-10-CM | POA: Diagnosis not present

## 2022-06-29 DIAGNOSIS — Z5181 Encounter for therapeutic drug level monitoring: Secondary | ICD-10-CM | POA: Diagnosis not present

## 2022-06-29 DIAGNOSIS — F902 Attention-deficit hyperactivity disorder, combined type: Secondary | ICD-10-CM | POA: Diagnosis not present

## 2022-06-29 DIAGNOSIS — F4312 Post-traumatic stress disorder, chronic: Secondary | ICD-10-CM | POA: Diagnosis not present

## 2022-06-29 DIAGNOSIS — Z79899 Other long term (current) drug therapy: Secondary | ICD-10-CM | POA: Diagnosis not present

## 2022-08-03 DIAGNOSIS — F4312 Post-traumatic stress disorder, chronic: Secondary | ICD-10-CM | POA: Diagnosis not present

## 2022-08-03 DIAGNOSIS — F902 Attention-deficit hyperactivity disorder, combined type: Secondary | ICD-10-CM | POA: Diagnosis not present

## 2022-09-14 DIAGNOSIS — F4312 Post-traumatic stress disorder, chronic: Secondary | ICD-10-CM | POA: Diagnosis not present

## 2022-09-14 DIAGNOSIS — F902 Attention-deficit hyperactivity disorder, combined type: Secondary | ICD-10-CM | POA: Diagnosis not present

## 2022-10-26 DIAGNOSIS — F4312 Post-traumatic stress disorder, chronic: Secondary | ICD-10-CM | POA: Diagnosis not present

## 2022-10-26 DIAGNOSIS — F902 Attention-deficit hyperactivity disorder, combined type: Secondary | ICD-10-CM | POA: Diagnosis not present

## 2022-11-29 DIAGNOSIS — F4312 Post-traumatic stress disorder, chronic: Secondary | ICD-10-CM | POA: Diagnosis not present

## 2022-11-30 DIAGNOSIS — F4312 Post-traumatic stress disorder, chronic: Secondary | ICD-10-CM | POA: Diagnosis not present

## 2022-11-30 DIAGNOSIS — F902 Attention-deficit hyperactivity disorder, combined type: Secondary | ICD-10-CM | POA: Diagnosis not present

## 2022-12-27 DIAGNOSIS — F411 Generalized anxiety disorder: Secondary | ICD-10-CM | POA: Diagnosis not present

## 2022-12-27 DIAGNOSIS — F3131 Bipolar disorder, current episode depressed, mild: Secondary | ICD-10-CM | POA: Diagnosis not present

## 2022-12-30 DIAGNOSIS — F3131 Bipolar disorder, current episode depressed, mild: Secondary | ICD-10-CM | POA: Diagnosis not present

## 2022-12-30 DIAGNOSIS — F902 Attention-deficit hyperactivity disorder, combined type: Secondary | ICD-10-CM | POA: Diagnosis not present

## 2022-12-30 DIAGNOSIS — F411 Generalized anxiety disorder: Secondary | ICD-10-CM | POA: Diagnosis not present

## 2023-01-11 DIAGNOSIS — F3131 Bipolar disorder, current episode depressed, mild: Secondary | ICD-10-CM | POA: Diagnosis not present

## 2023-01-11 DIAGNOSIS — F411 Generalized anxiety disorder: Secondary | ICD-10-CM | POA: Diagnosis not present

## 2023-01-25 DIAGNOSIS — F411 Generalized anxiety disorder: Secondary | ICD-10-CM | POA: Diagnosis not present

## 2023-01-25 DIAGNOSIS — F3131 Bipolar disorder, current episode depressed, mild: Secondary | ICD-10-CM | POA: Diagnosis not present

## 2023-02-03 DIAGNOSIS — F3131 Bipolar disorder, current episode depressed, mild: Secondary | ICD-10-CM | POA: Diagnosis not present

## 2023-02-03 DIAGNOSIS — F4312 Post-traumatic stress disorder, chronic: Secondary | ICD-10-CM | POA: Diagnosis not present

## 2023-02-11 DIAGNOSIS — B369 Superficial mycosis, unspecified: Secondary | ICD-10-CM | POA: Diagnosis not present

## 2023-05-04 DIAGNOSIS — F3131 Bipolar disorder, current episode depressed, mild: Secondary | ICD-10-CM | POA: Diagnosis not present

## 2023-05-04 DIAGNOSIS — F411 Generalized anxiety disorder: Secondary | ICD-10-CM | POA: Diagnosis not present

## 2023-05-12 DIAGNOSIS — F4312 Post-traumatic stress disorder, chronic: Secondary | ICD-10-CM | POA: Diagnosis not present

## 2023-05-12 DIAGNOSIS — F902 Attention-deficit hyperactivity disorder, combined type: Secondary | ICD-10-CM | POA: Diagnosis not present

## 2023-05-12 DIAGNOSIS — F411 Generalized anxiety disorder: Secondary | ICD-10-CM | POA: Diagnosis not present

## 2023-05-12 DIAGNOSIS — F3131 Bipolar disorder, current episode depressed, mild: Secondary | ICD-10-CM | POA: Diagnosis not present

## 2023-05-31 DIAGNOSIS — F3131 Bipolar disorder, current episode depressed, mild: Secondary | ICD-10-CM | POA: Diagnosis not present

## 2023-07-05 DIAGNOSIS — F411 Generalized anxiety disorder: Secondary | ICD-10-CM | POA: Diagnosis not present

## 2023-07-05 DIAGNOSIS — F3131 Bipolar disorder, current episode depressed, mild: Secondary | ICD-10-CM | POA: Diagnosis not present

## 2023-07-05 DIAGNOSIS — F902 Attention-deficit hyperactivity disorder, combined type: Secondary | ICD-10-CM | POA: Diagnosis not present

## 2023-07-07 DIAGNOSIS — F902 Attention-deficit hyperactivity disorder, combined type: Secondary | ICD-10-CM | POA: Diagnosis not present

## 2023-07-07 DIAGNOSIS — F411 Generalized anxiety disorder: Secondary | ICD-10-CM | POA: Diagnosis not present

## 2023-07-07 DIAGNOSIS — F3131 Bipolar disorder, current episode depressed, mild: Secondary | ICD-10-CM | POA: Diagnosis not present

## 2023-07-07 DIAGNOSIS — F4312 Post-traumatic stress disorder, chronic: Secondary | ICD-10-CM | POA: Diagnosis not present

## 2023-08-09 DIAGNOSIS — F3131 Bipolar disorder, current episode depressed, mild: Secondary | ICD-10-CM | POA: Diagnosis not present

## 2023-08-09 DIAGNOSIS — F411 Generalized anxiety disorder: Secondary | ICD-10-CM | POA: Diagnosis not present

## 2023-08-09 DIAGNOSIS — F902 Attention-deficit hyperactivity disorder, combined type: Secondary | ICD-10-CM | POA: Diagnosis not present

## 2023-09-07 DIAGNOSIS — F3131 Bipolar disorder, current episode depressed, mild: Secondary | ICD-10-CM | POA: Diagnosis not present

## 2023-09-07 DIAGNOSIS — F902 Attention-deficit hyperactivity disorder, combined type: Secondary | ICD-10-CM | POA: Diagnosis not present

## 2023-09-07 DIAGNOSIS — F411 Generalized anxiety disorder: Secondary | ICD-10-CM | POA: Diagnosis not present

## 2023-10-05 DIAGNOSIS — F411 Generalized anxiety disorder: Secondary | ICD-10-CM | POA: Diagnosis not present

## 2023-10-05 DIAGNOSIS — F3131 Bipolar disorder, current episode depressed, mild: Secondary | ICD-10-CM | POA: Diagnosis not present

## 2023-10-05 DIAGNOSIS — F902 Attention-deficit hyperactivity disorder, combined type: Secondary | ICD-10-CM | POA: Diagnosis not present

## 2023-10-06 DIAGNOSIS — F411 Generalized anxiety disorder: Secondary | ICD-10-CM | POA: Diagnosis not present

## 2023-10-06 DIAGNOSIS — F4312 Post-traumatic stress disorder, chronic: Secondary | ICD-10-CM | POA: Diagnosis not present

## 2023-10-06 DIAGNOSIS — F902 Attention-deficit hyperactivity disorder, combined type: Secondary | ICD-10-CM | POA: Diagnosis not present

## 2023-11-02 DIAGNOSIS — F3131 Bipolar disorder, current episode depressed, mild: Secondary | ICD-10-CM | POA: Diagnosis not present

## 2023-11-02 DIAGNOSIS — F902 Attention-deficit hyperactivity disorder, combined type: Secondary | ICD-10-CM | POA: Diagnosis not present

## 2023-11-02 DIAGNOSIS — F411 Generalized anxiety disorder: Secondary | ICD-10-CM | POA: Diagnosis not present

## 2023-12-07 DIAGNOSIS — F411 Generalized anxiety disorder: Secondary | ICD-10-CM | POA: Diagnosis not present

## 2023-12-07 DIAGNOSIS — F3131 Bipolar disorder, current episode depressed, mild: Secondary | ICD-10-CM | POA: Diagnosis not present

## 2023-12-30 DIAGNOSIS — E66811 Obesity, class 1: Secondary | ICD-10-CM | POA: Diagnosis not present

## 2023-12-30 DIAGNOSIS — Z713 Dietary counseling and surveillance: Secondary | ICD-10-CM | POA: Diagnosis not present

## 2024-01-04 DIAGNOSIS — F3131 Bipolar disorder, current episode depressed, mild: Secondary | ICD-10-CM | POA: Diagnosis not present

## 2024-01-04 DIAGNOSIS — F411 Generalized anxiety disorder: Secondary | ICD-10-CM | POA: Diagnosis not present

## 2024-01-11 DIAGNOSIS — E66811 Obesity, class 1: Secondary | ICD-10-CM | POA: Diagnosis not present

## 2024-01-25 DIAGNOSIS — F4312 Post-traumatic stress disorder, chronic: Secondary | ICD-10-CM | POA: Diagnosis not present

## 2024-02-02 DIAGNOSIS — F4312 Post-traumatic stress disorder, chronic: Secondary | ICD-10-CM | POA: Diagnosis not present

## 2024-02-02 DIAGNOSIS — F411 Generalized anxiety disorder: Secondary | ICD-10-CM | POA: Diagnosis not present

## 2024-02-02 DIAGNOSIS — F3131 Bipolar disorder, current episode depressed, mild: Secondary | ICD-10-CM | POA: Diagnosis not present

## 2024-02-02 DIAGNOSIS — F902 Attention-deficit hyperactivity disorder, combined type: Secondary | ICD-10-CM | POA: Diagnosis not present

## 2024-02-03 DIAGNOSIS — Z6833 Body mass index (BMI) 33.0-33.9, adult: Secondary | ICD-10-CM | POA: Diagnosis not present

## 2024-02-03 DIAGNOSIS — E669 Obesity, unspecified: Secondary | ICD-10-CM | POA: Diagnosis not present

## 2024-02-22 DIAGNOSIS — F3131 Bipolar disorder, current episode depressed, mild: Secondary | ICD-10-CM | POA: Diagnosis not present

## 2024-02-22 DIAGNOSIS — E66811 Obesity, class 1: Secondary | ICD-10-CM | POA: Diagnosis not present

## 2024-02-22 DIAGNOSIS — Z713 Dietary counseling and surveillance: Secondary | ICD-10-CM | POA: Diagnosis not present

## 2024-02-22 DIAGNOSIS — F411 Generalized anxiety disorder: Secondary | ICD-10-CM | POA: Diagnosis not present
